# Patient Record
Sex: Male | Born: 1990 | Race: White | Hispanic: No | Marital: Single | Smoking: Current every day smoker
Health system: Southern US, Community
[De-identification: ages and names within clinical notes are randomized; demographics above are authoritative.]

## PROBLEM LIST (undated history)

## (undated) DIAGNOSIS — F419 Anxiety disorder, unspecified: Secondary | ICD-10-CM

## (undated) DIAGNOSIS — F410 Panic disorder [episodic paroxysmal anxiety] without agoraphobia: Secondary | ICD-10-CM

## (undated) HISTORY — PX: KNEE SURGERY: SHX244

---
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
## 2011-01-30 LAB — CBC
Hemoglobin: 15.8 g/dL (ref 12.0–16.0)
MCHC: 34.2 g/dL (ref 31.0–37.0)
RBC: 5.07 MIL/uL (ref 3.80–5.70)
WBC: 7.2 10*3/uL (ref 4.5–13.5)

## 2011-01-30 LAB — DIFFERENTIAL
Basophils Relative: 0 % (ref 0–1)
Lymphs Abs: 2.3 10*3/uL (ref 1.1–4.8)
Monocytes Absolute: 0.6 10*3/uL (ref 0.2–1.2)
Monocytes Relative: 8 % (ref 3–11)
Neutro Abs: 4.2 10*3/uL (ref 1.7–8.0)
Neutrophils Relative %: 58 % (ref 43–71)

## 2011-01-30 LAB — ETHANOL: Alcohol, Ethyl (B): <5 mg/dL (ref 0–10)

## 2011-01-30 LAB — RAPID URINE DRUG SCREEN, HOSP PERFORMED
Amphetamines: NOT DETECTED
Barbiturates: NOT DETECTED

## 2011-01-30 LAB — BASIC METABOLIC PANEL
CO2: 32 mEq/L (ref 19–32)
Calcium: 9.7 mg/dL (ref 8.4–10.5)
Chloride: 104 mEq/L (ref 96–112)
Sodium: 140 mEq/L (ref 135–145)

## 2013-03-08 ENCOUNTER — Encounter (HOSPITAL_COMMUNITY): Payer: Self-pay

## 2013-03-08 ENCOUNTER — Emergency Department (HOSPITAL_COMMUNITY): Payer: Self-pay

## 2013-03-08 ENCOUNTER — Emergency Department (HOSPITAL_COMMUNITY)
Admission: EM | Admit: 2013-03-08 | Discharge: 2013-03-08 | Disposition: A | Discharge status: Home / Self Care | Payer: Self-pay | Attending: Emergency Medicine | Admitting: Emergency Medicine

## 2013-03-08 DIAGNOSIS — F17200 Nicotine dependence, unspecified, uncomplicated: Secondary | ICD-10-CM | POA: Insufficient documentation

## 2013-03-08 DIAGNOSIS — S8990XA Unspecified injury of unspecified lower leg, initial encounter: Secondary | ICD-10-CM | POA: Insufficient documentation

## 2013-03-08 DIAGNOSIS — X500XXA Overexertion from strenuous movement or load, initial encounter: Secondary | ICD-10-CM | POA: Insufficient documentation

## 2013-03-08 DIAGNOSIS — F411 Generalized anxiety disorder: Secondary | ICD-10-CM | POA: Insufficient documentation

## 2013-03-08 DIAGNOSIS — S99919A Unspecified injury of unspecified ankle, initial encounter: Secondary | ICD-10-CM | POA: Insufficient documentation

## 2013-03-08 DIAGNOSIS — Y9389 Activity, other specified: Secondary | ICD-10-CM | POA: Insufficient documentation

## 2013-03-08 DIAGNOSIS — Y929 Unspecified place or not applicable: Secondary | ICD-10-CM | POA: Insufficient documentation

## 2013-03-08 DIAGNOSIS — Z9889 Other specified postprocedural states: Secondary | ICD-10-CM | POA: Insufficient documentation

## 2013-03-08 DIAGNOSIS — M25561 Pain in right knee: Secondary | ICD-10-CM

## 2013-03-08 HISTORY — DX: Anxiety disorder, unspecified: F41.9

## 2013-03-08 HISTORY — DX: Panic disorder (episodic paroxysmal anxiety): F41.0

## 2013-03-08 MED ORDER — OXYCODONE-ACETAMINOPHEN 5-325 MG PO TABS
2.0000 | ORAL_TABLET | Freq: Once | ORAL | Status: AC
  Administered 2013-03-08: 2 via ORAL
  Filled 2013-03-08: qty 2

## 2013-03-08 MED ORDER — OXYCODONE-ACETAMINOPHEN 5-325 MG PO TABS: 1.0000 | ORAL_TABLET | ORAL | Status: AC | PRN

## 2013-03-08 NOTE — ED Provider Notes (Signed)
History  This chart was scribed for Flint Melter, MD by Shari Heritage, ED Scribe. The patient was seen in room APA12/APA12. Patient's care was started at 1343.   CSN: [id number]  Arrival date & time 03/08/13  1323   First MD Initiated Contact with Patient 03/08/13 1343      Chief Complaint  Patient presents with  . Knee Pain     The history is provided by the patient. No language interpreter was used.     HPI Comments: Anthony Solomon is a 22 y.o. male who presents to the Emergency Department complaining of constant, non-radiating, moderate right knee pain with decreased sensation of right lower extremity onset yesterday. Patient states that he squatted down yesterday and heard both of his knees pop. Patient alludes to having dislocated both of his knees and states that this has occurred in the past. He states that he was able to reduce both yesterday with his girlfriend's help, but now he is having persistent symptoms in his right lower extremity. He denies back pain, nausea, vomiting, fever or chills. He has a medical history of anxiety. Patient is a current every day smoker.  Past Medical History  Diagnosis Date  . Anxiety   . Panic     Past Surgical History  Procedure Laterality Date  . Knee surgery      left    No family history on file.  History  Substance Use Topics  . Smoking status: Current Every Day Smoker    Types: Cigarettes  . Smokeless tobacco: Not on file  . Alcohol Use: No      Review of Systems  Constitutional: Negative for fever and chills.  Gastrointestinal: Negative for nausea and vomiting.  Musculoskeletal: Negative for back pain.  Neurological: Positive for numbness.    Allergies  Review of patient's allergies indicates no known allergies.  Home Medications   Current Outpatient Rx  Name  Route  Sig  Dispense  Refill  . oxyCODONE-acetaminophen (PERCOCET) 5-325 MG per tablet   Oral   Take 1 tablet by mouth every 4 (four) hours as needed  for pain.   20 tablet   0     Triage Vitals: BP 119/74  Pulse 99  Temp(Src) 97.6 F (36.4 C) (Oral)  Resp 20  Ht 5\' 11"  (1.803 m)  Wt 155 lb (70.308 kg)  BMI 21.63 kg/m2  SpO2 100%  Physical Exam  Nursing note and vitals reviewed. Constitutional: He is oriented to person, place, and time. He appears well-developed and well-nourished.  HENT:  Head: Normocephalic and atraumatic.  Right Ear: External ear normal.  Left Ear: External ear normal.  Eyes: Conjunctivae and EOM are normal. Pupils are equal, round, and reactive to light.  Neck: Normal range of motion and phonation normal. Neck supple.  Cardiovascular: Normal rate, regular rhythm, normal heart sounds and intact distal pulses.   Pulmonary/Chest: Effort normal and breath sounds normal. He exhibits no bony tenderness.  Abdominal: Soft. Normal appearance. There is no tenderness.  Musculoskeletal: Normal range of motion.  Normal ROM of back. Sits up easily. Able to extend right knee off the bed.   Neurological: He is alert and oriented to person, place, and time. He has normal strength. No cranial nerve deficit or sensory deficit. He exhibits normal muscle tone. Coordination normal.  Skin: Skin is warm, dry and intact.  Psychiatric: He has a normal mood and affect. His behavior is normal. Judgment and thought content normal.    ED Course  Procedures (including critical care time) DIAGNOSTIC STUDIES: Filed Vitals:   03/08/13 1327  BP: 119/74  Pulse: 99  Temp: 97.6 F (36.4 C)  TempSrc: Oral  Resp: 20  Height: 5\' 11"  (1.803 m)  Weight: 155 lb (70.308 kg)  SpO2: 100%     COORDINATION OF CARE: 1:46 PM- Patient informed of current plan for treatment and evaluation and agrees with plan at this time.   1:52 PM- Reviewed substance abuse databank - patient has no entries. Will order 2 tablets of Percocet 5-325 mg.   2:41 PM- Updated patient on x-ray results which are negative for acute abnormality.Will order knee  immobilizer and prescriptions to manage pain at home.  Informed patient that he should follow up with an orthopedist for continued, specialized care. Will provide a work note for patient as he works in [employer].  Knee immobilizer, ordered an assisted by nursing   Dg Knee Complete 4 Views Right  03/08/2013   *RADIOLOGY REPORT*  Clinical Data: Knee pain with reported dislocation  RIGHT KNEE - COMPLETE 4+ VIEW  Comparison: None.  Findings: No acute fracture or dislocation is noted.  No significant soft tissue abnormality is seen.  The patella is appropriately placed.  IMPRESSION: No acute abnormality noted.   Original Report Authenticated By: Alcide Clever, M.D.     1. Knee pain, right       MDM  Reported right leg patellar subluxation and/or dislocation, spontaneously reduced. No instability of the patellofemoral joint. The fracture. Nonspecific right leg numbness is likely related to his injury and indicative of a radicular injury. He is stable for discharge with outpatient follow, with orthopedics  Nursing Notes Reviewed/ Care Coordinated, and agree without changes. Applicable Imaging Reviewed.  Interpretation of Laboratory Data incorporated into ED treatment   Plan: Home Medications- Percocet; Home Treatments- knee, immobilizer when up; Recommended follow up- orthopedics 1 week        I personally performed the services described in this documentation, which was scribed in my presence. The recorded information has been reviewed and is accurate.     Flint Melter, MD 03/08/13 2223

## 2013-03-08 NOTE — ED Notes (Signed)
Pt reports that he felt both knees pop yesterday and cont. To have pain to right knee today, pt has wrap to knee and is using crutches from home. Right leg is numb.

## 2013-03-10 ENCOUNTER — Encounter (HOSPITAL_COMMUNITY): Payer: Self-pay | Admitting: *Deleted

## 2013-03-10 ENCOUNTER — Emergency Department (HOSPITAL_COMMUNITY)
Admission: EM | Admit: 2013-03-10 | Discharge: 2013-03-10 | Disposition: A | Discharge status: Home / Self Care | Payer: Self-pay | Attending: Emergency Medicine | Admitting: Emergency Medicine

## 2013-03-10 DIAGNOSIS — M25561 Pain in right knee: Secondary | ICD-10-CM

## 2013-03-10 DIAGNOSIS — Z9889 Other specified postprocedural states: Secondary | ICD-10-CM | POA: Insufficient documentation

## 2013-03-10 DIAGNOSIS — R209 Unspecified disturbances of skin sensation: Secondary | ICD-10-CM | POA: Insufficient documentation

## 2013-03-10 DIAGNOSIS — F17200 Nicotine dependence, unspecified, uncomplicated: Secondary | ICD-10-CM | POA: Insufficient documentation

## 2013-03-10 DIAGNOSIS — M549 Dorsalgia, unspecified: Secondary | ICD-10-CM | POA: Insufficient documentation

## 2013-03-10 DIAGNOSIS — M25569 Pain in unspecified knee: Secondary | ICD-10-CM | POA: Insufficient documentation

## 2013-03-10 DIAGNOSIS — F411 Generalized anxiety disorder: Secondary | ICD-10-CM | POA: Insufficient documentation

## 2013-03-10 MED ORDER — HYDROCODONE-ACETAMINOPHEN 5-325 MG PO TABS
2.0000 | ORAL_TABLET | Freq: Once | ORAL | Status: AC
  Administered 2013-03-10: 2 via ORAL
  Filled 2013-03-10: qty 2

## 2013-03-10 MED ORDER — HYDROCODONE-ACETAMINOPHEN 7.5-325 MG PO TABS: 1.0000 | ORAL_TABLET | ORAL | Status: DC | PRN

## 2013-03-10 MED ORDER — MELOXICAM 7.5 MG PO TABS: ORAL_TABLET | ORAL | Status: DC

## 2013-03-10 MED ORDER — ONDANSETRON HCL 4 MG PO TABS
4.0000 mg | ORAL_TABLET | Freq: Once | ORAL | Status: AC
  Administered 2013-03-10: 4 mg via ORAL
  Filled 2013-03-10: qty 1

## 2013-03-10 MED ORDER — KETOROLAC TROMETHAMINE 10 MG PO TABS
10.0000 mg | ORAL_TABLET | Freq: Once | ORAL | Status: AC
  Administered 2013-03-10: 10 mg via ORAL
  Filled 2013-03-10: qty 1

## 2013-03-10 NOTE — ED Provider Notes (Signed)
History     CSN: [id number]  Arrival date & time 03/10/13  1738   First MD Initiated Contact with Patient 03/10/13 1930      Chief Complaint  Patient presents with  . Knee Pain    (Consider location/radiation/quality/duration/timing/severity/associated sxs/prior treatment) HPI Comments: Patient states he's been having problems with his knees" coming out of place" for approximately 2 years. 2-3 days ago the patient was bending and the knee came out of place, it went back in but he has been having pain since that time he's also been having some discomfort and at times numbness of his foot on the right. The patient has not had any previous operations or procedures on the right knee or extremity. He has at times had some back discomfort. But has not had this formally evaluated. The patient was referred to orthopedics, when he was in the emergency department 2 days ago, but states that he does not have insurance, and he does not have the cash co-pay in order to see the orthopedist. The patient presents to the emergency department tonight because he is having pain and request assistance with his pain until he can make some arrangements to be seen by someone concerning his knee and foot.  Patient is a 22 y.o. male presenting with knee pain. The history is provided by the patient.  Knee Pain Location:  Knee Time since incident:  3 days Associated symptoms: back pain   Associated symptoms: no neck pain     Past Medical History  Diagnosis Date  . Anxiety   . Panic     Past Surgical History  Procedure Laterality Date  . Knee surgery      left    History reviewed. No pertinent family history.  History  Substance Use Topics  . Smoking status: Current Every Day Smoker    Types: Cigarettes  . Smokeless tobacco: Not on file  . Alcohol Use: No      Review of Systems  Constitutional: Negative for activity change.       All ROS Neg except as noted in HPI  HENT: Negative for  nosebleeds and neck pain.   Eyes: Negative for photophobia and discharge.  Respiratory: Negative for cough, shortness of breath and wheezing.   Cardiovascular: Negative for chest pain and palpitations.  Gastrointestinal: Negative for abdominal pain and blood in stool.  Genitourinary: Negative for dysuria, frequency and hematuria.  Musculoskeletal: Positive for back pain and arthralgias.  Skin: Negative.   Neurological: Negative for dizziness, seizures and speech difficulty.  Psychiatric/Behavioral: Negative for hallucinations and confusion. The patient is nervous/anxious.     Allergies  Review of patient's allergies indicates no known allergies.  Home Medications   Current Outpatient Rx  Name  Route  Sig  Dispense  Refill  . oxyCODONE-acetaminophen (PERCOCET) 5-325 MG per tablet   Oral   Take 1 tablet by mouth every 4 (four) hours as needed for pain.   20 tablet   0     BP 147/97  Pulse 98  Temp(Src) 97 F (36.1 C) (Oral)  Resp 18  Ht 5\' 11"  (1.803 m)  Wt 155 lb (70.308 kg)  BMI 21.63 kg/m2  SpO2 100%  Physical Exam  Nursing note and vitals reviewed. Constitutional: He is oriented to person, place, and time. He appears well-developed and well-nourished.  Non-toxic appearance.  HENT:  Head: Normocephalic.  Right Ear: Tympanic membrane and external ear normal.  Left Ear: Tympanic membrane and external ear normal.  Eyes:  EOM and lids are normal. Pupils are equal, round, and reactive to light.  Neck: Normal range of motion. Neck supple. Carotid bruit is not present.  Cardiovascular: Normal rate, regular rhythm, normal heart sounds, intact distal pulses and normal pulses.   Pulmonary/Chest: Breath sounds normal. No respiratory distress.  Abdominal: Soft. Bowel sounds are normal. There is no tenderness. There is no guarding.  Musculoskeletal:       Right knee: He exhibits decreased range of motion and swelling.       Lumbar back: He exhibits decreased range of motion,  tenderness and spasm.  Pt will not allow complete evaluation due to pain and discomfort.  Lymphadenopathy:       Head (right side): No submandibular adenopathy present.       Head (left side): No submandibular adenopathy present.    He has no cervical adenopathy.  Neurological: He is alert and oriented to person, place, and time. He has normal strength. No cranial nerve deficit or sensory deficit.  Skin: Skin is warm and dry.  Psychiatric: His speech is normal. His mood appears anxious.    ED Course  Procedures (including critical care time)  Labs Reviewed - No data to display No results found.   No diagnosis found.    MDM  I have reviewed nursing notes, vital signs, and all appropriate lab and imaging results for this patient. Pt has hx of back and knee problem. He was seen in ED 2 days ago and continues to have pain. He states he is out of pain meds. He also states he can not pay for co-pay to see orthopedics. Pt advised to contact the hospital social worker for resources. Rx for mobic and norco 7.5 given to the patient.       Kathie Dike, PA-C 03/11/13 1503

## 2013-03-10 NOTE — ED Notes (Signed)
Pt reports no new injury, complaining of right knee pain rated a 10/10 stating "fells like someone stabbing his leg.". Pt states unable to see ortho because of financial reason. Pt wearing knee immobilizer & crutches.

## 2013-03-10 NOTE — ED Notes (Signed)
Pt alert & oriented x4, stable gait. Patient given discharge instructions, paperwork & prescription(s). Patient  instructed to stop at the registration desk to finish any additional paperwork. Patient verbalized understanding. Pt left department w/ no further questions. 

## 2013-03-10 NOTE — ED Notes (Signed)
Rt knee pain, seen here 2 days ago. And advised to See Dr Hilda Lias. No money for a visit.  Using crutches and KI

## 2013-03-11 NOTE — ED Provider Notes (Signed)
Medical screening examination/treatment/procedure(s) were performed by non-physician practitioner and as supervising physician I was immediately available for consultation/collaboration.   Benny Lennert, MD 03/11/13 [phone]

## 2015-04-12 ENCOUNTER — Emergency Department (HOSPITAL_COMMUNITY)
Admission: EM | Admit: 2015-04-12 | Discharge: 2015-04-12 | Disposition: A | Discharge status: Home / Self Care | Payer: Medicaid Other | Attending: Emergency Medicine | Admitting: Emergency Medicine

## 2015-04-12 ENCOUNTER — Emergency Department (HOSPITAL_COMMUNITY): Payer: Medicaid Other

## 2015-04-12 ENCOUNTER — Encounter (HOSPITAL_COMMUNITY): Payer: Self-pay | Admitting: *Deleted

## 2015-04-12 DIAGNOSIS — R0789 Other chest pain: Secondary | ICD-10-CM | POA: Diagnosis not present

## 2015-04-12 DIAGNOSIS — R079 Chest pain, unspecified: Secondary | ICD-10-CM | POA: Diagnosis present

## 2015-04-12 DIAGNOSIS — Z720 Tobacco use: Secondary | ICD-10-CM | POA: Diagnosis not present

## 2015-04-12 DIAGNOSIS — Z8659 Personal history of other mental and behavioral disorders: Secondary | ICD-10-CM | POA: Diagnosis not present

## 2015-04-12 LAB — BASIC METABOLIC PANEL
Anion gap: 6 (ref 5–15)
BUN: 12 mg/dL (ref 6–20)
CHLORIDE: 104 mmol/L (ref 101–111)
CO2: 30 mmol/L (ref 22–32)
Calcium: 9.5 mg/dL (ref 8.9–10.3)
Creatinine, Ser: 0.86 mg/dL (ref 0.61–1.24)
GFR calc non Af Amer: >60 mL/min (ref >60)
GLUCOSE: 93 mg/dL (ref 65–99)
POTASSIUM: 4.4 mmol/L (ref 3.5–5.1)
SODIUM: 140 mmol/L (ref 135–145)

## 2015-04-12 LAB — CBC WITH DIFFERENTIAL/PLATELET
Basophils Absolute: 0 10*3/uL (ref 0.0–0.1)
Basophils Relative: 0 % (ref 0–1)
EOS ABS: 0.1 10*3/uL (ref 0.0–0.7)
Eosinophils Relative: 2 % (ref 0–5)
HCT: 46.1 % (ref 39.0–52.0)
Hemoglobin: 15.3 g/dL (ref 13.0–17.0)
Lymphocytes Relative: 30 % (ref 12–46)
Lymphs Abs: 2.5 10*3/uL (ref 0.7–4.0)
MCH: 31.9 pg (ref 26.0–34.0)
MCHC: 33.2 g/dL (ref 30.0–36.0)
MCV: 96 fL (ref 78.0–100.0)
Monocytes Absolute: 0.7 10*3/uL (ref 0.1–1.0)
Monocytes Relative: 8 % (ref 3–12)
NEUTROS PCT: 60 % (ref 43–77)
Neutro Abs: 5 10*3/uL (ref 1.7–7.7)
PLATELETS: 187 10*3/uL (ref 150–400)
RBC: 4.8 MIL/uL (ref 4.22–5.81)
RDW: 13.2 % (ref 11.5–15.5)
WBC: 8.3 10*3/uL (ref 4.0–10.5)

## 2015-04-12 LAB — TROPONIN I: Troponin I: <0.03 ng/mL (ref <0.031)

## 2015-04-12 LAB — D-DIMER, QUANTITATIVE: D-Dimer, Quant: <0.27 ug/mL-FEU (ref 0.00–0.48)

## 2015-04-12 MED ORDER — IBUPROFEN 800 MG PO TABS
800.0000 mg | ORAL_TABLET | Freq: Once | ORAL | Status: AC
  Administered 2015-04-12: 800 mg via ORAL
  Filled 2015-04-12: qty 1

## 2015-04-12 MED ORDER — IBUPROFEN 800 MG PO TABS: 800.0000 mg | ORAL_TABLET | Freq: Three times a day (TID) | ORAL | Status: AC

## 2015-04-12 NOTE — ED Notes (Signed)
Lt chest pain , onset this am. Nausea,

## 2015-04-12 NOTE — Discharge Instructions (Signed)
Chest Wall Pain There is no evidence of heart attack or blood clot in the lung. Follow up with your doctor. Return to the ED if you develop new or worsening symptoms. Chest wall pain is pain in or around the bones and muscles of your chest. It may take up to 6 weeks to get better. It may take longer if you must stay physically active in your work and activities.  CAUSES  Chest wall pain may happen on its own. However, it may be caused by:  A viral illness like the flu.  Injury.  Coughing.  Exercise.  Arthritis.  Fibromyalgia.  Shingles. HOME CARE INSTRUCTIONS   Avoid overtiring physical activity. Try not to strain or perform activities that cause pain. This includes any activities using your chest or your abdominal and side muscles, especially if heavy weights are used.  Put ice on the sore area.  Put ice in a plastic bag.  Place a towel between your skin and the bag.  Leave the ice on for 15-20 minutes per hour while awake for the first 2 days.  Only take over-the-counter or prescription medicines for pain, discomfort, or fever as directed by your caregiver. SEEK IMMEDIATE MEDICAL CARE IF:   Your pain increases, or you are very uncomfortable.  You have a fever.  Your chest pain becomes worse.  You have new, unexplained symptoms.  You have nausea or vomiting.  You feel sweaty or lightheaded.  You have a cough with phlegm (sputum), or you cough up blood. MAKE SURE YOU:   Understand these instructions.  Will watch your condition.  Will get help right away if you are not doing well or get worse. Document Released: 10/09/2005 Document Revised: 01/01/2012 Document Reviewed: 06/05/2011 [facility] Patient Information 2015 [place], [place]. This information is not intended to replace advice given to you by your health care provider. Make sure you discuss any questions you have with your health care provider.

## 2015-04-12 NOTE — ED Provider Notes (Signed)
CSN: [id number]     Arrival date & time 04/12/15  1351 History   First MD Initiated Contact with Patient 04/12/15 1620     Chief Complaint  Patient presents with  . Chest Pain     (Consider location/radiation/quality/duration/timing/severity/associated sxs/prior Treatment) HPI Comments: Patient presents with constant left-sided chest pain that woke him from sleep at 9:30 AM. Pain has been constant now 7 hours. It is worse with palpation and movement of his left arm. Nothing makes it better. He denies any shortness of breath, nausea, vomiting, diaphoresis or chills. No history of previous chest pain. He is a smoker. Denies any previous history of cardiac symptoms or pulmonary symptoms. Eyes any cough or fever. Denies any history of asthma. Denies any lifting injury to his chest. He did not try to take anything for it. The pain radiates to his left neck. No radiation to arm or back.  The history is provided by the patient.    Past Medical History  Diagnosis Date  . Anxiety   . Panic    Past Surgical History  Procedure Laterality Date  . Knee surgery      left   History reviewed. No pertinent family history. History  Substance Use Topics  . Smoking status: Current Every Day Smoker    Types: Cigarettes  . Smokeless tobacco: Not on file  . Alcohol Use: Yes    Review of Systems  Constitutional: Negative for fever, activity change and appetite change.  HENT: Negative for congestion and rhinorrhea.   Eyes: Negative for visual disturbance.  Respiratory: Positive for chest tightness. Negative for cough and shortness of breath.   Cardiovascular: Positive for chest pain. Negative for leg swelling.  Gastrointestinal: Negative for nausea, vomiting and abdominal pain.  Genitourinary: Negative for dysuria, urgency and hematuria.  Musculoskeletal: Negative for myalgias, arthralgias and neck pain.  Skin: Negative for rash.  Neurological: Negative for dizziness, weakness, light-headedness  and headaches.  A complete 10 system review of systems was obtained and all systems are negative except as noted in the HPI and PMH.      Allergies  Review of patient's allergies indicates no known allergies.  Home Medications   Prior to Admission medications   Medication Sig Start Date End Date Taking? Authorizing Provider  ibuprofen (ADVIL,MOTRIN) 800 MG tablet Take 1 tablet (800 mg total) by mouth 3 (three) times daily. 04/12/15   Glynn Octave, MD  oxyCODONE-acetaminophen (PERCOCET) 5-325 MG per tablet Take 1 tablet by mouth every 4 (four) hours as needed for pain. Patient not taking: Reported on 04/12/2015 03/08/13   Mancel Bale, MD   BP 137/77 mmHg  Pulse 77  Temp(Src) 98 F (36.7 C) (Oral)  Resp 16  Ht 5\' 10"  (1.778 m)  Wt 145 lb (65.772 kg)  BMI 20.81 kg/m2  SpO2 100% Physical Exam  Constitutional: He is oriented to person, place, and time. He appears well-developed and well-nourished. No distress.  HENT:  Head: Normocephalic and atraumatic.  Mouth/Throat: Oropharynx is clear and moist. No oropharyngeal exudate.  Eyes: Conjunctivae and EOM are normal. Pupils are equal, round, and reactive to light.  Neck: Normal range of motion. Neck supple.  No meningismus.  Cardiovascular: Normal rate, regular rhythm, normal heart sounds and intact distal pulses.   No murmur heard. Pulmonary/Chest: Effort normal and breath sounds normal. No respiratory distress. He exhibits tenderness.  Left chest wall tenderness, worse with palpation, worse with left arm movement. No rash.  Abdominal: Soft. There is no tenderness. There is  no rebound and no guarding.  Musculoskeletal: Normal range of motion. He exhibits no edema or tenderness.  Neurological: He is alert and oriented to person, place, and time. No cranial nerve deficit. He exhibits normal muscle tone. Coordination normal.  No ataxia on finger to nose bilaterally. No pronator drift. 5/5 strength throughout. CN 2-12 intact. Negative  Romberg. Equal grip strength. Sensation intact. Gait is normal.   Skin: Skin is warm.  Psychiatric: He has a normal mood and affect. His behavior is normal.  Nursing note and vitals reviewed.   ED Course  Procedures (including critical care time) Labs Review Labs Reviewed  CBC WITH DIFFERENTIAL/PLATELET  BASIC METABOLIC PANEL  TROPONIN I  D-DIMER, QUANTITATIVE (NOT AT [facility])  TROPONIN I    Imaging Review Dg Chest 2 View  04/12/2015   CLINICAL DATA:  Chest pain.  EXAM: CHEST  2 VIEW  COMPARISON:  04/15/2009  FINDINGS: Midline trachea.  Normal heart size and mediastinal contours.  Sharp costophrenic angles.  No pneumothorax.  Clear lungs.  A mild pectus excavatum deformity.  IMPRESSION: No active cardiopulmonary disease.   Electronically Signed   By: Jeronimo Greaves M.D.   On: 04/12/2015 14:31     EKG Interpretation   Date/Time:  Monday April 12 2015 14:04:54 EDT Ventricular Rate:  113 PR Interval:  112 QRS Duration: 76 QT Interval:  324 QTC Calculation: 444 R Axis:   88 Text Interpretation:  Sinus tachycardia Right atrial enlargement  Borderline ECG Confirmed by COOK  MD, Londyn ([id number]) on 04/12/2015 2:09:39  PM      MDM   Final diagnoses:  Chest wall pain   constant left-sided chest pain for the past 7 hours. Worse with palpation left arm movement. EKG sinus tachycardia.  Chest x-rays negative. No rash on exam. With tachycardia will check d-dimer. Pain is reproducible and atypical for ACS.  Troponin negative. D-dimer negative. Low suspicion for ACS or PE. Treat for musculoskeletal chest pain with antiinflammatories. Return precautions discussed.   Glynn Octave, MD 04/13/15 [phone]

## 2015-12-13 IMAGING — DX DG CHEST 2V
2 series · 2 of 2 positions shown · non-contrast
Comparison: 04/15/2009

CLINICAL DATA: Chest pain.

EXAM:
CHEST  2 VIEW

[chest pa]
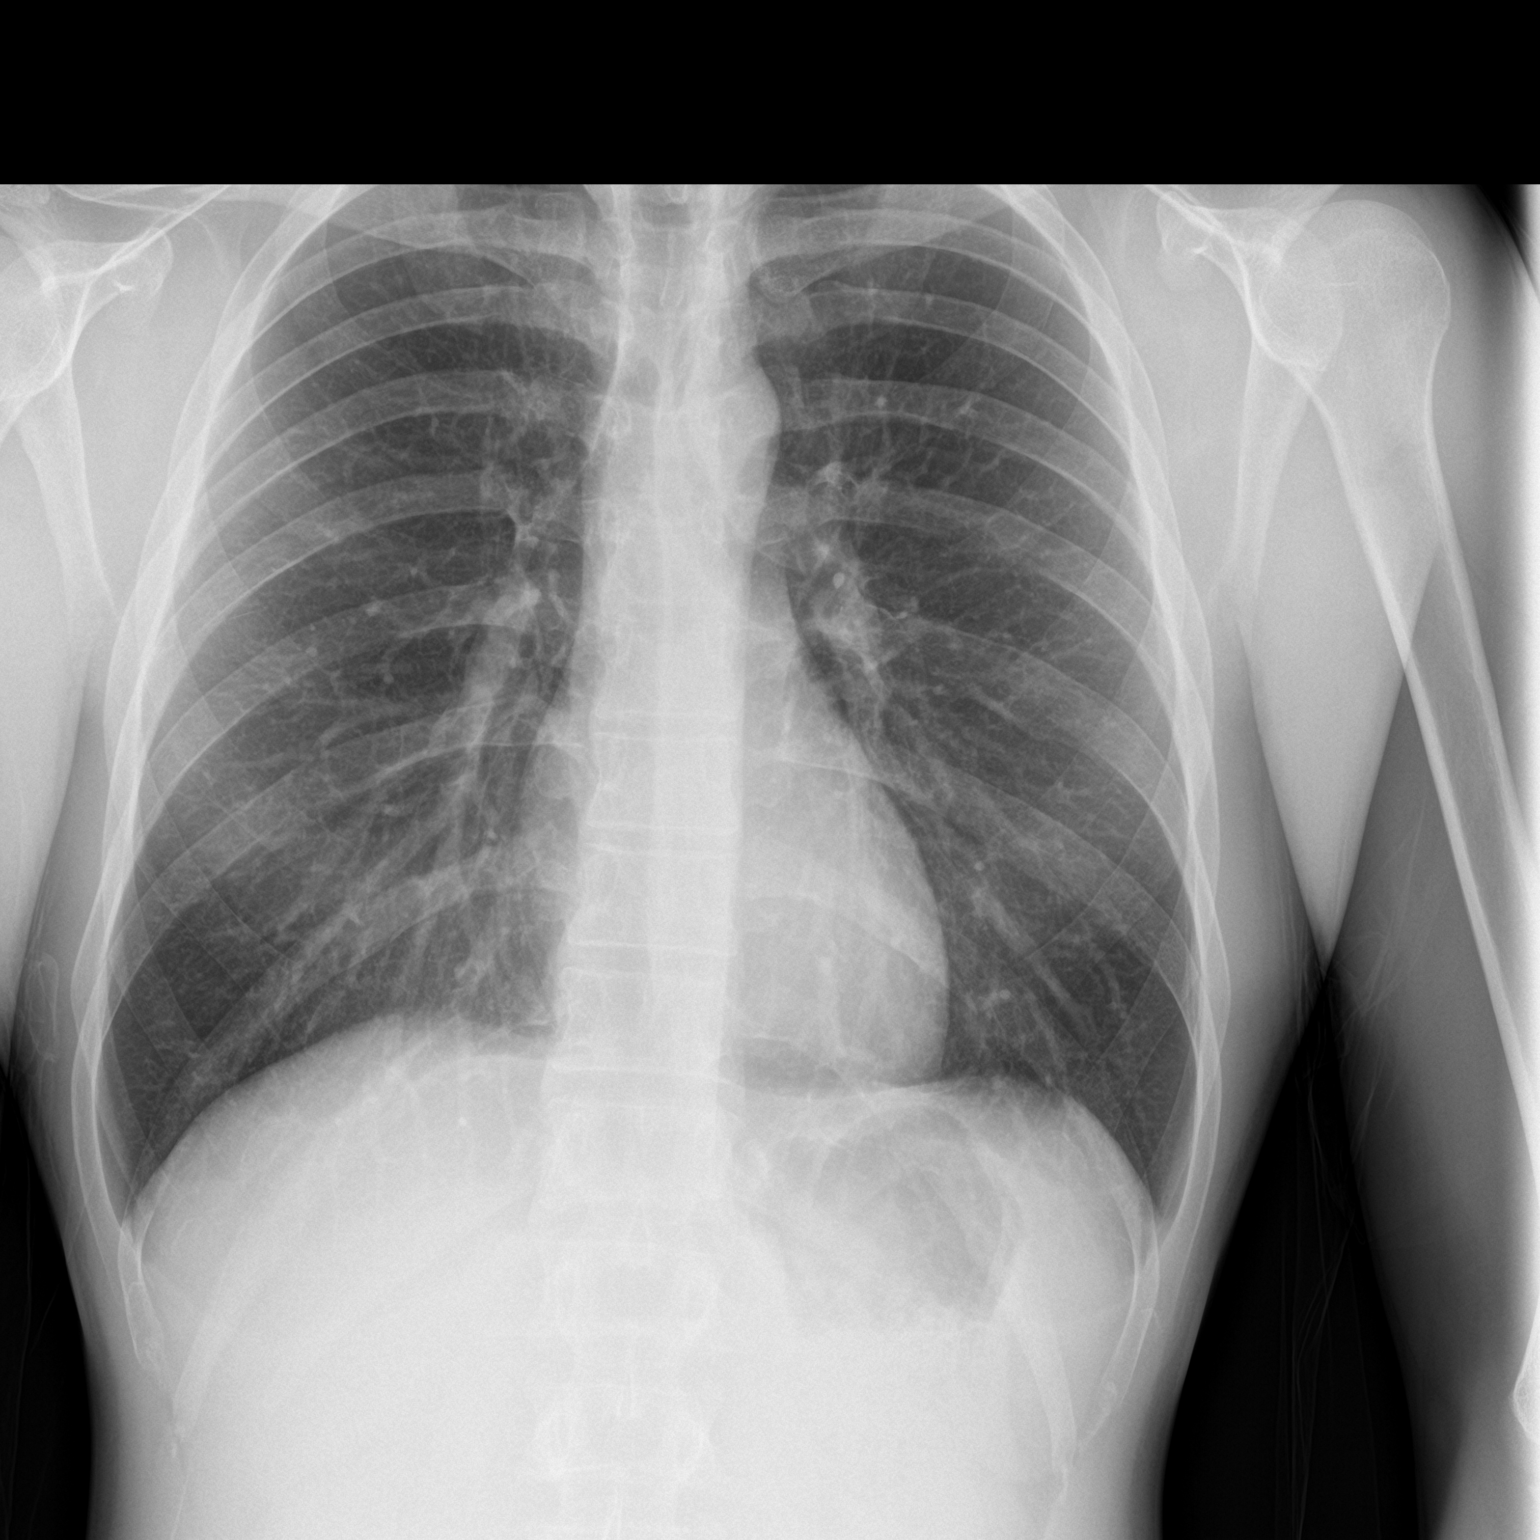

[chest lat]
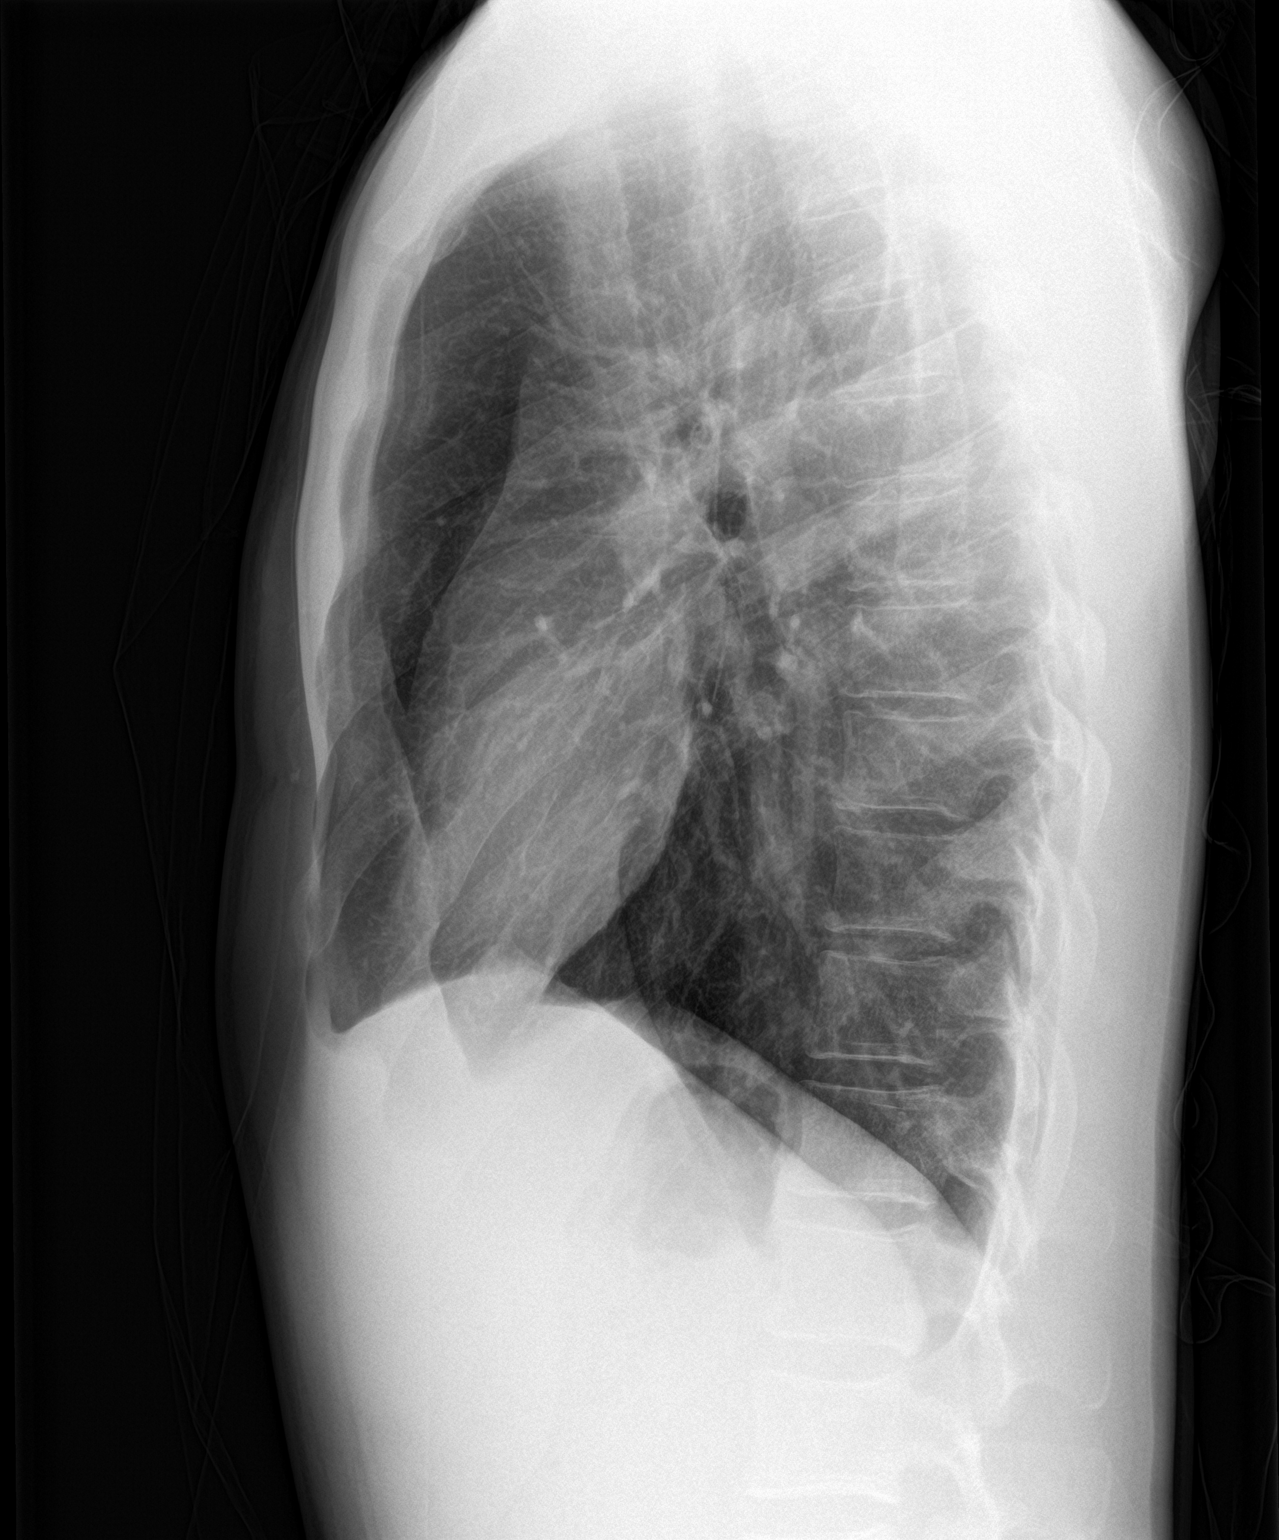

[2 of 2 positions shown; findings below may reference images not displayed]

FINDINGS: Midline trachea.  Normal heart size and mediastinal contours.

Sharp costophrenic angles.  No pneumothorax.  Clear lungs.

A mild pectus excavatum deformity.
IMPRESSION: No active cardiopulmonary disease.
